# Patient Record
Sex: Female | Born: 1965 | Race: White | Hispanic: No | Marital: Married
Health system: Southern US, Community
[De-identification: ages and names within clinical notes are randomized; demographics above are authoritative.]

---

## 1997-02-15 ENCOUNTER — Other Ambulatory Visit: Admission: RE | Admit: 1997-02-15 | Discharge: 1997-02-15 | Payer: Self-pay | Admitting: Obstetrics and Gynecology

## 1997-04-14 ENCOUNTER — Inpatient Hospital Stay (HOSPITAL_COMMUNITY): Admission: AD | Admit: 1997-04-14 | Discharge: 1997-04-17 | Payer: Self-pay | Admitting: Obstetrics & Gynecology

## 1998-06-18 ENCOUNTER — Other Ambulatory Visit: Admission: RE | Admit: 1998-06-18 | Discharge: 1998-06-18 | Payer: Self-pay | Admitting: Obstetrics & Gynecology

## 1999-07-29 ENCOUNTER — Other Ambulatory Visit: Admission: RE | Admit: 1999-07-29 | Discharge: 1999-07-29 | Payer: Self-pay | Admitting: Obstetrics & Gynecology

## 2000-10-26 ENCOUNTER — Inpatient Hospital Stay (HOSPITAL_COMMUNITY): Admission: AD | Admit: 2000-10-26 | Discharge: 2000-10-29 | Payer: Self-pay | Admitting: Obstetrics and Gynecology

## 2000-12-04 ENCOUNTER — Other Ambulatory Visit: Admission: RE | Admit: 2000-12-04 | Discharge: 2000-12-04 | Payer: Self-pay | Admitting: Obstetrics and Gynecology

## 2001-12-06 ENCOUNTER — Other Ambulatory Visit: Admission: RE | Admit: 2001-12-06 | Discharge: 2001-12-06 | Payer: Self-pay | Admitting: Obstetrics & Gynecology

## 2002-12-16 ENCOUNTER — Other Ambulatory Visit: Admission: RE | Admit: 2002-12-16 | Discharge: 2002-12-16 | Payer: Self-pay | Admitting: Obstetrics & Gynecology

## 2003-12-25 ENCOUNTER — Other Ambulatory Visit: Admission: RE | Admit: 2003-12-25 | Discharge: 2003-12-25 | Payer: Self-pay | Admitting: Obstetrics & Gynecology

## 2005-02-04 ENCOUNTER — Other Ambulatory Visit: Admission: RE | Admit: 2005-02-04 | Discharge: 2005-02-04 | Payer: Self-pay | Admitting: Obstetrics & Gynecology

## 2012-06-15 ENCOUNTER — Other Ambulatory Visit: Payer: Self-pay | Admitting: Obstetrics & Gynecology

## 2012-06-15 DIAGNOSIS — R928 Other abnormal and inconclusive findings on diagnostic imaging of breast: Secondary | ICD-10-CM

## 2012-06-25 ENCOUNTER — Ambulatory Visit
Admission: RE | Admit: 2012-06-25 | Discharge: 2012-06-25 | Disposition: A | Discharge status: Home / Self Care | Payer: Managed Care, Other (non HMO) | Source: Ambulatory Visit | Attending: Obstetrics & Gynecology | Admitting: Obstetrics & Gynecology

## 2012-06-25 DIAGNOSIS — R928 Other abnormal and inconclusive findings on diagnostic imaging of breast: Secondary | ICD-10-CM

## 2014-11-11 IMAGING — MG MM DIGITAL DIAGNOSTIC UNILAT*L*
2 series · 2 of 2 positions shown · non-contrast
Comparison: With priors

CLINICAL DATA: Abnormal left screening mammogram

DIGITAL DIAGNOSTIC LEFT MAMMOGRAM WITH CAD

[L MLO]
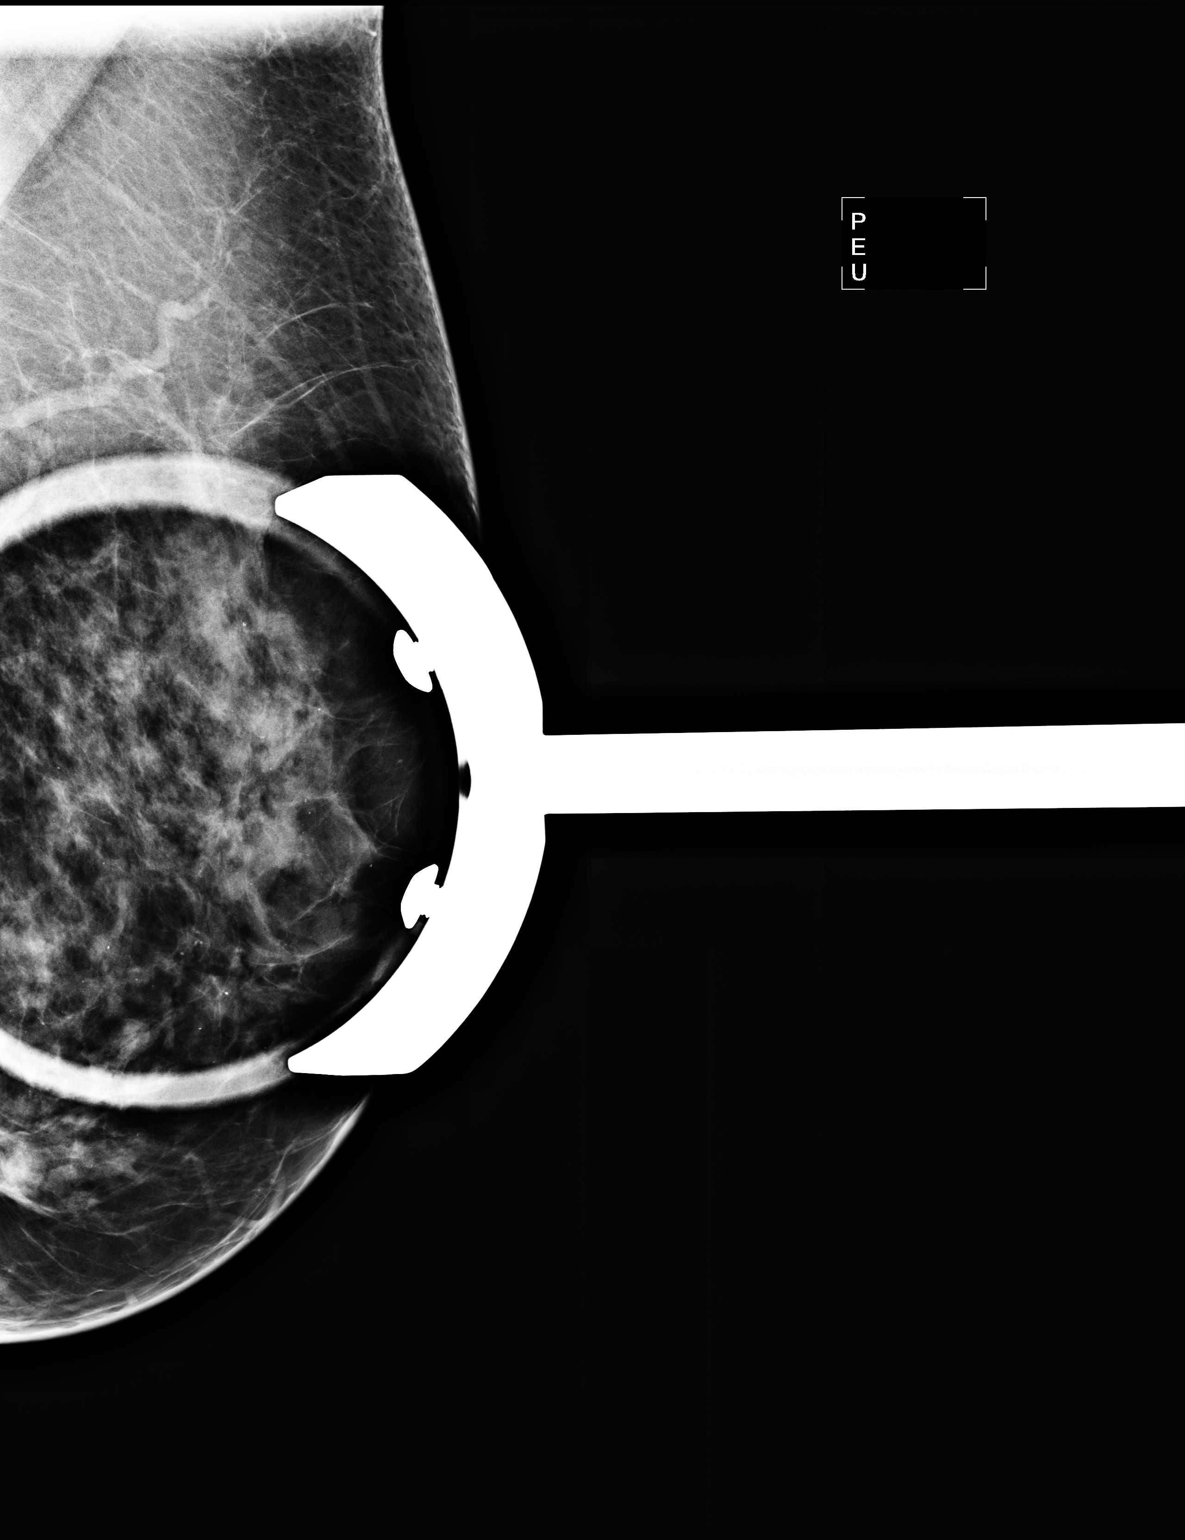

[L ML]
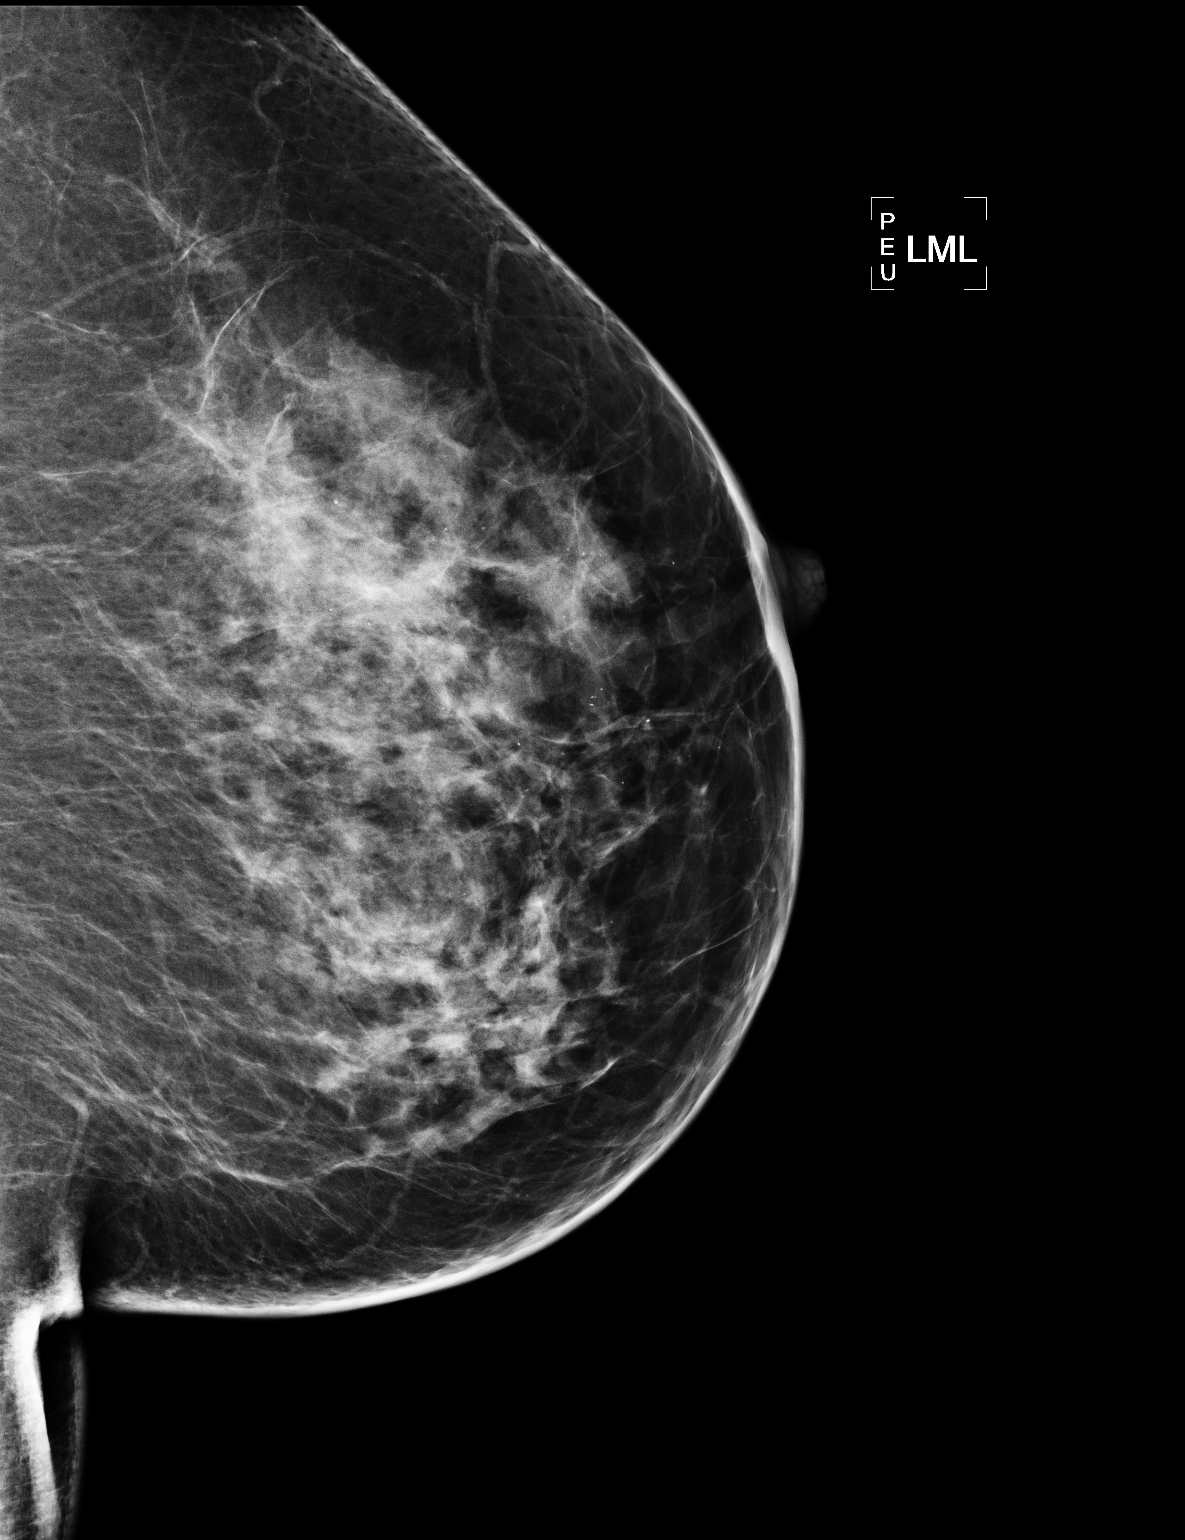

[2 of 2 positions shown; findings below may reference images not displayed]

FINDINGS: ACR Breast Density Category 2: There is a scattered fibroglandular
pattern.

True lateral and spot compression views of the left breast were
performed.  There is no persistent mass, distortion or malignant-
type microcalcifications.

Mammographic images were processed with CAD.
IMPRESSION: No evidence of malignancy in the left breast.

RECOMMENDATION:
Bilateral screening mammogram in 1 year is recommended.

I have discussed the findings and recommendations with the patient.
Results were also provided in writing at the conclusion of the
visit.  If applicable, a reminder letter will be sent to the
patient regarding her next appointment.

BI-RADS CATEGORY 1:  Negative.

## 2015-09-02 ENCOUNTER — Encounter (INDEPENDENT_AMBULATORY_CARE_PROVIDER_SITE_OTHER): Payer: Self-pay

## 2015-09-02 ENCOUNTER — Ambulatory Visit: Payer: Managed Care, Other (non HMO)

## 2015-09-02 ENCOUNTER — Ambulatory Visit (INDEPENDENT_AMBULATORY_CARE_PROVIDER_SITE_OTHER): Payer: Managed Care, Other (non HMO) | Admitting: Sports Medicine

## 2015-09-02 ENCOUNTER — Encounter: Payer: Self-pay | Admitting: Sports Medicine

## 2015-09-02 DIAGNOSIS — M2040 Other hammer toe(s) (acquired), unspecified foot: Secondary | ICD-10-CM

## 2015-09-02 DIAGNOSIS — M79671 Pain in right foot: Secondary | ICD-10-CM

## 2015-09-02 DIAGNOSIS — M7740 Metatarsalgia, unspecified foot: Secondary | ICD-10-CM | POA: Diagnosis not present

## 2015-09-02 DIAGNOSIS — S93601A Unspecified sprain of right foot, initial encounter: Secondary | ICD-10-CM

## 2015-09-02 DIAGNOSIS — M21619 Bunion of unspecified foot: Secondary | ICD-10-CM | POA: Diagnosis not present

## 2015-09-02 MED ORDER — METHYLPREDNISOLONE 4 MG PO TBPK: ORAL_TABLET | ORAL | 0 refills | Status: AC

## 2015-09-02 NOTE — Progress Notes (Signed)
Subjective: Samantha DelaineMichele E Spooner is a 50 y.o. female patient who presents to office for evaluation of right foot pain. Patient states 3 weeks ago, was sitting on a bar stool and her foot slipped and fell in between the rails since has had pain across the ball of the foot and at the crest where the toes bend. States that pain is worse with exercise after about 30 minutes of extensive walking or exercise on treadmill. Reports icing helps. Denies any other treatment. No other issues.  There are no active problems to display for this patient.   No current outpatient prescriptions on file prior to visit.   No current facility-administered medications on file prior to visit.     Allergies  Allergen Reactions  . Bactrim [Sulfamethoxazole-Trimethoprim]   . Codeine   . Latex     Objective:  General: Alert and oriented x3 in no acute distress  Dermatology: No open lesions bilateral lower extremities, no webspace macerations, no ecchymosis bilateral, all nails x 10 are well manicured.  Vascular: Dorsalis Pedis and Posterior Tibial pedal pulses palpable, Capillary Fill Time 3 seconds,(+) pedal hair growth bilateral, no edema bilateral lower extremities, Temperature gradient within normal limits.  Neurology: Michaell CowingGross sensation intact via light touch bilateral,  (-)Tinels sign bilateral.   Musculoskeletal: Mild tenderness with palpation Diffusely across the ball of the foot, mild pain with stressing the forefoot right foot , early bunion and hammertoe, right greater than left,  Strength within normal limits in all groups bilateral.   Xrays  Right Foot   Impression:Normal osseous mineralization. There is very early mild bunion and hammertoe deformity. No fracture or dislocation. No other acute findings.  Assessment and Plan: Problem List Items Addressed This Visit    None    Visit Diagnoses    Right foot pain    -  Primary   Relevant Medications   methylPREDNISolone (MEDROL DOSEPAK) 4 MG TBPK  tablet   Other Relevant Orders   DG Foot 2 Views Right   Metatarsalgia, unspecified laterality       Relevant Medications   methylPREDNISolone (MEDROL DOSEPAK) 4 MG TBPK tablet   Bunion       Relevant Medications   methylPREDNISolone (MEDROL DOSEPAK) 4 MG TBPK tablet   Hammertoe, unspecified laterality       Relevant Medications   methylPREDNISolone (MEDROL DOSEPAK) 4 MG TBPK tablet   Sprain of foot, right, initial encounter       Relevant Medications   methylPREDNISolone (MEDROL DOSEPAK) 4 MG TBPK tablet     -Complete examination performed -Xrays reviewed -Discussed treatement optionsFor likely sprain injury with continued pain along the metatarsals -RxMedrol Dosepak to take as instructed -Applied metatarsal pad and instructed on use. Recommended stiff good supportive shoes. Recommend rest, ice, elevation until symptoms improve and to refrain from high impact exercises - Recommend rest, ice, elevation and refrain from high impact exercises until symptoms resolve  -Patient to return to office in 2-3 weeks or sooner if condition worsens.  Asencion Islamitorya Yasemin Rabon, DPM

## 2015-09-23 ENCOUNTER — Encounter: Payer: Self-pay | Admitting: Sports Medicine

## 2015-09-23 ENCOUNTER — Ambulatory Visit (INDEPENDENT_AMBULATORY_CARE_PROVIDER_SITE_OTHER): Payer: Managed Care, Other (non HMO) | Admitting: Sports Medicine

## 2015-09-23 DIAGNOSIS — M21619 Bunion of unspecified foot: Secondary | ICD-10-CM

## 2015-09-23 DIAGNOSIS — M779 Enthesopathy, unspecified: Secondary | ICD-10-CM

## 2015-09-23 DIAGNOSIS — M79671 Pain in right foot: Secondary | ICD-10-CM

## 2015-09-23 DIAGNOSIS — S93601D Unspecified sprain of right foot, subsequent encounter: Secondary | ICD-10-CM | POA: Diagnosis not present

## 2015-09-23 DIAGNOSIS — M7740 Metatarsalgia, unspecified foot: Secondary | ICD-10-CM

## 2015-09-23 DIAGNOSIS — M2040 Other hammer toe(s) (acquired), unspecified foot: Secondary | ICD-10-CM

## 2015-09-23 MED ORDER — DICLOFENAC SODIUM 75 MG PO TBEC: 75.0000 mg | DELAYED_RELEASE_TABLET | Freq: Two times a day (BID) | ORAL | 0 refills | Status: DC

## 2015-09-23 NOTE — Progress Notes (Signed)
Subjective: Noah DelaineMichele E Erdman is a 50 y.o. female patient who returns to office for follow up evaluation of right foot pain states that pain at ball of foot is better but still has some pain off and on at right ankle. Patient states she tolerated dose pack fine and rested and iced as much as she could. Denies any other treatment. No other issues.  There are no active problems to display for this patient.   Current Outpatient Prescriptions on File Prior to Visit  Medication Sig Dispense Refill  . atorvastatin (LIPITOR) 10 MG tablet     . CYCLAFEM 1/35 tablet     . methylPREDNISolone (MEDROL DOSEPAK) 4 MG TBPK tablet Take as instructed 21 tablet 0  . phentermine (ADIPEX-P) 37.5 MG tablet Take 37.5 mg by mouth daily.  1  . RESTASIS 0.05 % ophthalmic emulsion     . sertraline (ZOLOFT) 50 MG tablet      No current facility-administered medications on file prior to visit.     Allergies  Allergen Reactions  . Bactrim [Sulfamethoxazole-Trimethoprim]   . Codeine   . Latex     Objective:  General: Alert and oriented x3 in no acute distress  Dermatology: No open lesions bilateral lower extremities, no webspace macerations, no ecchymosis bilateral, all nails x 10 are well manicured.  Vascular: Dorsalis Pedis and Posterior Tibial pedal pulses palpable, Capillary Fill Time 3 seconds,(+) pedal hair growth bilateral, no edema bilateral lower extremities, Temperature gradient within normal limits.  Neurology: Gross sensation intact via light touch bilateral,  (-)Tinels sign bilateral.   Musculoskeletal: Notenderness with palpation Diffusely across the ball of the foot, mild pain with stressing the forefoot right foot and at peroneal tendons on right , early bunion and hammertoe, right greater than left,  Strength within normal limits in all groups bilateral.    Assessment and Plan: Problem List Items Addressed This Visit    None    Visit Diagnoses    Metatarsalgia, unspecified laterality    -   Primary   Relevant Medications   diclofenac (VOLTAREN) 75 MG EC tablet   Sprain of foot, right, subsequent encounter       Relevant Medications   diclofenac (VOLTAREN) 75 MG EC tablet   Tendonitis       R Peroneal    Relevant Medications   diclofenac (VOLTAREN) 75 MG EC tablet   Bunion       Relevant Medications   diclofenac (VOLTAREN) 75 MG EC tablet   Hammertoe, unspecified laterality       Relevant Medications   diclofenac (VOLTAREN) 75 MG EC tablet   Right foot pain       Relevant Medications   diclofenac (VOLTAREN) 75 MG EC tablet     -Complete examination performed -Discussed treatement optionsFor likely tendonitis secondary to sprain injury  -Rx Diclofenac to take as instructed -Dispensed fascial brace strapped from medial to lateral on right to protect peroneals and to provide stability and support -Continue with metatarsal pad as needed. Recommended stiff good supportive shoes. Recommend rest, ice, elevation until symptoms improve and to refrain from high impact exercises -Will consider return to treadmill and weaning from brace at next visit based on symptoms -Patient to return to office in 3 weeks or sooner if condition worsens.  Asencion Islamitorya Simpson Paulos, DPM

## 2015-10-14 ENCOUNTER — Encounter: Payer: Self-pay | Admitting: Sports Medicine

## 2015-10-14 ENCOUNTER — Ambulatory Visit (INDEPENDENT_AMBULATORY_CARE_PROVIDER_SITE_OTHER): Payer: Managed Care, Other (non HMO) | Admitting: Sports Medicine

## 2015-10-14 DIAGNOSIS — S93601D Unspecified sprain of right foot, subsequent encounter: Secondary | ICD-10-CM

## 2015-10-14 DIAGNOSIS — M7740 Metatarsalgia, unspecified foot: Secondary | ICD-10-CM | POA: Diagnosis not present

## 2015-10-14 DIAGNOSIS — M779 Enthesopathy, unspecified: Secondary | ICD-10-CM | POA: Diagnosis not present

## 2015-10-14 DIAGNOSIS — M79671 Pain in right foot: Secondary | ICD-10-CM | POA: Diagnosis not present

## 2015-10-14 MED ORDER — DICLOFENAC SODIUM 75 MG PO TBEC: 75.0000 mg | DELAYED_RELEASE_TABLET | Freq: Two times a day (BID) | ORAL | 0 refills | Status: AC

## 2015-10-14 NOTE — Progress Notes (Signed)
Subjective: Noah DelaineMichele E Gassmann is a 50 y.o. female patient who returns to office for follow up evaluation of right foot pain states that pain at ball of foot is better but still has some pain off and on at right ankle however much better than last visit; feels like brace really helps. Patient denies any other pedal complaints. No other issues.  There are no active problems to display for this patient.   Current Outpatient Prescriptions on File Prior to Visit  Medication Sig Dispense Refill  . atorvastatin (LIPITOR) 10 MG tablet     . CYCLAFEM 1/35 tablet     . methylPREDNISolone (MEDROL DOSEPAK) 4 MG TBPK tablet Take as instructed 21 tablet 0  . phentermine (ADIPEX-P) 37.5 MG tablet Take 37.5 mg by mouth daily.  1  . RESTASIS 0.05 % ophthalmic emulsion     . sertraline (ZOLOFT) 50 MG tablet      No current facility-administered medications on file prior to visit.     Allergies  Allergen Reactions  . Bactrim [Sulfamethoxazole-Trimethoprim]   . Codeine   . Latex     Objective:  General: Alert and oriented x3 in no acute distress  Dermatology: No open lesions bilateral lower extremities, no webspace macerations, no ecchymosis bilateral, all nails x 10 are well manicured.  Vascular: Dorsalis Pedis and Posterior Tibial pedal pulses palpable, Capillary Fill Time 3 seconds,(+) pedal hair growth bilateral, no edema bilateral lower extremities, Temperature gradient within normal limits.  Neurology: Gross sensation intact via light touch bilateral,  (-)Tinels sign bilateral.   Musculoskeletal: No tenderness with palpation across the ball of the foot, no pain with stressing the forefoot right foot, mild pain at peroneal tendons on right just posterior to lateral malleolus, early bunion and hammertoe, right greater than left,  Strength within normal limits in all groups bilateral.    Assessment and Plan: Problem List Items Addressed This Visit    None    Visit Diagnoses    Tendonitis    -   Primary   R Peroneal    Relevant Medications   diclofenac (VOLTAREN) 75 MG EC tablet   Metatarsalgia, unspecified laterality       resolved   Sprain of foot, right, subsequent encounter       Relevant Medications   diclofenac (VOLTAREN) 75 MG EC tablet   Right foot pain       Relevant Medications   diclofenac (VOLTAREN) 75 MG EC tablet     -Complete examination performed -Discussed treatement options for peroneal tendonitis secondary to sprain injury  -Refilled Diclofenac to take as instructed -Continue with fascial brace strapped from medial to lateral on right to protect peroneals and to provide stability and support -Continue with rest, ice, elevation until symptoms improve and to refrain from high impact exercises -Will consider return to treadmill and weaning from brace at next visit based on symptoms -Patient to return to office in 3 weeks or sooner if condition worsens. If fails to continue to improve may order MRI for further evaluation of tendons.   Asencion Islamitorya Kylyn Sookram, DPM

## 2015-11-04 ENCOUNTER — Ambulatory Visit (INDEPENDENT_AMBULATORY_CARE_PROVIDER_SITE_OTHER): Payer: Managed Care, Other (non HMO) | Admitting: Sports Medicine

## 2015-11-04 ENCOUNTER — Encounter: Payer: Self-pay | Admitting: Sports Medicine

## 2015-11-04 DIAGNOSIS — S93601D Unspecified sprain of right foot, subsequent encounter: Secondary | ICD-10-CM

## 2015-11-04 DIAGNOSIS — M779 Enthesopathy, unspecified: Secondary | ICD-10-CM | POA: Diagnosis not present

## 2015-11-04 DIAGNOSIS — M79671 Pain in right foot: Secondary | ICD-10-CM | POA: Diagnosis not present

## 2015-11-04 DIAGNOSIS — M21619 Bunion of unspecified foot: Secondary | ICD-10-CM | POA: Diagnosis not present

## 2015-11-04 DIAGNOSIS — M7740 Metatarsalgia, unspecified foot: Secondary | ICD-10-CM

## 2015-11-04 NOTE — Progress Notes (Signed)
Subjective: Samantha Sparks is a 50 y.o. female patient who returns to office for follow up evaluation of right foot pain states that pain is 75% improved. Occasionally has some pain at the lateral ankle on right and over bunion; feels like brace really helps. Reports that she [facility] and was able to tolerate it. Admits that she still gets some swelling that improves with icing and taking Advil or diclofenac as needed. Patient denies any other pedal complaints. No other issues.  There are no active problems to display for this patient.   Current Outpatient Prescriptions on File Prior to Visit  Medication Sig Dispense Refill  . atorvastatin (LIPITOR) 10 MG tablet     . CYCLAFEM 1/35 tablet     . diclofenac (VOLTAREN) 75 MG EC tablet Take 1 tablet (75 mg total) by mouth 2 (two) times daily. 30 tablet 0  . methylPREDNISolone (MEDROL DOSEPAK) 4 MG TBPK tablet Take as instructed 21 tablet 0  . phentermine (ADIPEX-P) 37.5 MG tablet Take 37.5 mg by mouth daily.  1  . RESTASIS 0.05 % ophthalmic emulsion     . sertraline (ZOLOFT) 50 MG tablet      No current facility-administered medications on file prior to visit.     Allergies  Allergen Reactions  . Bactrim [Sulfamethoxazole-Trimethoprim]   . Codeine   . Latex     Objective:  General: Alert and oriented x3 in no acute distress  Dermatology: No open lesions bilateral lower extremities, no webspace macerations, no ecchymosis bilateral, all nails x 10 are well manicured.  Vascular: Dorsalis Pedis and Posterior Tibial pedal pulses palpable, Capillary Fill Time 3 seconds,(+) pedal hair growth bilateral, no edema bilateral lower extremities, Temperature gradient within normal limits.  Neurology: Gross sensation intact via light touch bilateral,  (-)Tinels sign bilateral.   Musculoskeletal: No tenderness with palpation across the ball of the foot, no pain with stressing the forefoot right foot, mild  pain at peroneal tendons on right just posterior to lateral malleolus, early bunion and hammertoe, right greater than left,  Strength within normal limits in all groups bilateral.    Assessment and Plan: Problem List Items Addressed This Visit    None    Visit Diagnoses    Tendonitis    -  Primary   Sprain of foot, right, subsequent encounter       Metatarsalgia, unspecified laterality       Bunion       Right foot pain         -Complete examination performed -Discussed Continued care for peroneal tendonitis secondary to sprain injury and bunion -Patient declined injection therapy since symptoms are improving -Continue diclofenac until completed -Continue with fascial brace strapped from medial to lateral on right to protect peroneals and to provide stability and support. Slowly wean as instructed over the next month. -Continue with rest, ice, elevation until symptoms improve -Patient may slowly resume physical activity and exercise to tolerance -Gave patient offloading padding to use under first metatarsal bunion area on right foot, this works well, Patient may benefit from orthotics. -Patient to return to office in as needed or sooner if condition worsens.  Asencion Islamitorya Cacie Gaskins, DPM

## 2019-12-14 LAB — EXTERNAL GENERIC LAB PROCEDURE: COLOGUARD: NEGATIVE

## 2020-02-19 DIAGNOSIS — M542 Cervicalgia: Secondary | ICD-10-CM | POA: Diagnosis not present

## 2020-02-19 DIAGNOSIS — M25511 Pain in right shoulder: Secondary | ICD-10-CM | POA: Diagnosis not present

## 2020-12-14 DIAGNOSIS — N951 Menopausal and female climacteric states: Secondary | ICD-10-CM | POA: Diagnosis not present

## 2021-10-14 DIAGNOSIS — Z0000 Encounter for general adult medical examination without abnormal findings: Secondary | ICD-10-CM | POA: Diagnosis not present

## 2021-10-14 DIAGNOSIS — Z6831 Body mass index (BMI) 31.0-31.9, adult: Secondary | ICD-10-CM | POA: Diagnosis not present

## 2021-10-14 DIAGNOSIS — E7800 Pure hypercholesterolemia, unspecified: Secondary | ICD-10-CM | POA: Diagnosis not present

## 2021-10-14 DIAGNOSIS — Z1331 Encounter for screening for depression: Secondary | ICD-10-CM | POA: Diagnosis not present

## 2021-10-15 DIAGNOSIS — Z0000 Encounter for general adult medical examination without abnormal findings: Secondary | ICD-10-CM | POA: Diagnosis not present

## 2021-10-15 DIAGNOSIS — Z13220 Encounter for screening for lipoid disorders: Secondary | ICD-10-CM | POA: Diagnosis not present

## 2021-11-30 DIAGNOSIS — Z1231 Encounter for screening mammogram for malignant neoplasm of breast: Secondary | ICD-10-CM | POA: Diagnosis not present

## 2021-11-30 DIAGNOSIS — Z01419 Encounter for gynecological examination (general) (routine) without abnormal findings: Secondary | ICD-10-CM | POA: Diagnosis not present

## 2021-11-30 DIAGNOSIS — Z6831 Body mass index (BMI) 31.0-31.9, adult: Secondary | ICD-10-CM | POA: Diagnosis not present

## 2021-11-30 DIAGNOSIS — E7800 Pure hypercholesterolemia, unspecified: Secondary | ICD-10-CM | POA: Diagnosis not present

## 2021-11-30 DIAGNOSIS — Z0000 Encounter for general adult medical examination without abnormal findings: Secondary | ICD-10-CM | POA: Diagnosis not present

## 2022-03-28 DIAGNOSIS — H9203 Otalgia, bilateral: Secondary | ICD-10-CM | POA: Diagnosis not present

## 2022-04-27 DIAGNOSIS — L578 Other skin changes due to chronic exposure to nonionizing radiation: Secondary | ICD-10-CM | POA: Diagnosis not present

## 2022-04-27 DIAGNOSIS — L814 Other melanin hyperpigmentation: Secondary | ICD-10-CM | POA: Diagnosis not present

## 2022-04-27 DIAGNOSIS — B079 Viral wart, unspecified: Secondary | ICD-10-CM | POA: Diagnosis not present

## 2022-07-14 DIAGNOSIS — R29890 Loss of height: Secondary | ICD-10-CM | POA: Diagnosis not present

## 2022-07-14 DIAGNOSIS — M4726 Other spondylosis with radiculopathy, lumbar region: Secondary | ICD-10-CM | POA: Diagnosis not present

## 2022-07-14 DIAGNOSIS — M47816 Spondylosis without myelopathy or radiculopathy, lumbar region: Secondary | ICD-10-CM | POA: Diagnosis not present

## 2022-07-14 DIAGNOSIS — M545 Low back pain, unspecified: Secondary | ICD-10-CM | POA: Diagnosis not present

## 2022-11-08 DIAGNOSIS — L814 Other melanin hyperpigmentation: Secondary | ICD-10-CM | POA: Diagnosis not present

## 2022-11-08 DIAGNOSIS — L821 Other seborrheic keratosis: Secondary | ICD-10-CM | POA: Diagnosis not present

## 2022-11-08 DIAGNOSIS — D485 Neoplasm of uncertain behavior of skin: Secondary | ICD-10-CM | POA: Diagnosis not present

## 2022-12-07 DIAGNOSIS — E7800 Pure hypercholesterolemia, unspecified: Secondary | ICD-10-CM | POA: Diagnosis not present

## 2022-12-07 DIAGNOSIS — Z0000 Encounter for general adult medical examination without abnormal findings: Secondary | ICD-10-CM | POA: Diagnosis not present

## 2022-12-07 DIAGNOSIS — Z1231 Encounter for screening mammogram for malignant neoplasm of breast: Secondary | ICD-10-CM | POA: Diagnosis not present

## 2022-12-07 DIAGNOSIS — Z01419 Encounter for gynecological examination (general) (routine) without abnormal findings: Secondary | ICD-10-CM | POA: Diagnosis not present

## 2022-12-13 ENCOUNTER — Encounter: Payer: Self-pay | Admitting: Obstetrics & Gynecology

## 2022-12-13 ENCOUNTER — Other Ambulatory Visit: Payer: Self-pay | Admitting: Obstetrics & Gynecology

## 2022-12-13 DIAGNOSIS — R928 Other abnormal and inconclusive findings on diagnostic imaging of breast: Secondary | ICD-10-CM

## 2022-12-23 ENCOUNTER — Ambulatory Visit
Admission: RE | Admit: 2022-12-23 | Discharge: 2022-12-23 | Disposition: A | Discharge status: Home / Self Care | Payer: Managed Care, Other (non HMO) | Source: Ambulatory Visit | Attending: Obstetrics & Gynecology | Admitting: Obstetrics & Gynecology

## 2022-12-23 ENCOUNTER — Ambulatory Visit
Admission: RE | Admit: 2022-12-23 | Discharge: 2022-12-23 | Disposition: A | Discharge status: Home / Self Care | Payer: BC Managed Care – PPO | Source: Ambulatory Visit | Attending: Obstetrics & Gynecology | Admitting: Obstetrics & Gynecology

## 2022-12-23 DIAGNOSIS — R928 Other abnormal and inconclusive findings on diagnostic imaging of breast: Secondary | ICD-10-CM | POA: Diagnosis not present

## 2022-12-26 DIAGNOSIS — Z1211 Encounter for screening for malignant neoplasm of colon: Secondary | ICD-10-CM | POA: Diagnosis not present

## 2023-01-03 ENCOUNTER — Other Ambulatory Visit: Payer: Managed Care, Other (non HMO)

## 2023-01-04 LAB — COLOGUARD: COLOGUARD: NEGATIVE

## 2023-05-25 DIAGNOSIS — R6889 Other general symptoms and signs: Secondary | ICD-10-CM | POA: Diagnosis not present

## 2023-05-25 DIAGNOSIS — F4320 Adjustment disorder, unspecified: Secondary | ICD-10-CM | POA: Diagnosis not present

## 2023-05-25 DIAGNOSIS — Z1339 Encounter for screening examination for other mental health and behavioral disorders: Secondary | ICD-10-CM | POA: Diagnosis not present

## 2023-05-25 DIAGNOSIS — Z6831 Body mass index (BMI) 31.0-31.9, adult: Secondary | ICD-10-CM | POA: Diagnosis not present

## 2023-09-29 DIAGNOSIS — M25552 Pain in left hip: Secondary | ICD-10-CM | POA: Diagnosis not present

## 2023-09-29 DIAGNOSIS — M25562 Pain in left knee: Secondary | ICD-10-CM | POA: Diagnosis not present

## 2023-11-08 DIAGNOSIS — B070 Plantar wart: Secondary | ICD-10-CM | POA: Diagnosis not present

## 2023-11-08 DIAGNOSIS — L814 Other melanin hyperpigmentation: Secondary | ICD-10-CM | POA: Diagnosis not present

## 2023-11-08 DIAGNOSIS — L821 Other seborrheic keratosis: Secondary | ICD-10-CM | POA: Diagnosis not present

## 2023-11-08 DIAGNOSIS — D225 Melanocytic nevi of trunk: Secondary | ICD-10-CM | POA: Diagnosis not present

## 2023-11-08 DIAGNOSIS — D2239 Melanocytic nevi of other parts of face: Secondary | ICD-10-CM | POA: Diagnosis not present

## 2023-12-25 DIAGNOSIS — Z124 Encounter for screening for malignant neoplasm of cervix: Secondary | ICD-10-CM | POA: Diagnosis not present

## 2023-12-25 DIAGNOSIS — E7800 Pure hypercholesterolemia, unspecified: Secondary | ICD-10-CM | POA: Diagnosis not present

## 2023-12-25 DIAGNOSIS — Z0000 Encounter for general adult medical examination without abnormal findings: Secondary | ICD-10-CM | POA: Diagnosis not present

## 2023-12-25 DIAGNOSIS — Z01419 Encounter for gynecological examination (general) (routine) without abnormal findings: Secondary | ICD-10-CM | POA: Diagnosis not present

## 2023-12-25 DIAGNOSIS — Z1231 Encounter for screening mammogram for malignant neoplasm of breast: Secondary | ICD-10-CM | POA: Diagnosis not present

## 2023-12-25 DIAGNOSIS — E559 Vitamin D deficiency, unspecified: Secondary | ICD-10-CM | POA: Diagnosis not present

## 2023-12-25 DIAGNOSIS — E538 Deficiency of other specified B group vitamins: Secondary | ICD-10-CM | POA: Diagnosis not present
